# Patient Record
Sex: Male | Born: 2000
Health system: Southern US, Community
[De-identification: ages and names within clinical notes are randomized; demographics above are authoritative.]

## PROBLEM LIST (undated history)

## (undated) DIAGNOSIS — J45909 Unspecified asthma, uncomplicated: Secondary | ICD-10-CM

## (undated) DIAGNOSIS — T7840XA Allergy, unspecified, initial encounter: Secondary | ICD-10-CM

## (undated) HISTORY — DX: Unspecified asthma, uncomplicated: J45.909

## (undated) HISTORY — DX: Allergy, unspecified, initial encounter: T78.40XA

---
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
## 2018-03-23 DIAGNOSIS — L730 Acne keloid: Secondary | ICD-10-CM | POA: Diagnosis not present

## 2018-10-13 ENCOUNTER — Encounter: Payer: Self-pay | Admitting: Psychiatry

## 2018-10-13 ENCOUNTER — Ambulatory Visit: Payer: 59 | Admitting: Psychiatry

## 2018-10-13 ENCOUNTER — Other Ambulatory Visit: Payer: Self-pay

## 2018-10-13 VITALS — Ht 73.0 in | Wt >= 6400 oz

## 2018-10-13 DIAGNOSIS — F50819 Binge eating disorder, unspecified: Secondary | ICD-10-CM

## 2018-10-13 DIAGNOSIS — F5081 Binge eating disorder: Secondary | ICD-10-CM

## 2018-10-13 DIAGNOSIS — F900 Attention-deficit hyperactivity disorder, predominantly inattentive type: Secondary | ICD-10-CM | POA: Diagnosis not present

## 2018-10-13 DIAGNOSIS — F428 Other obsessive-compulsive disorder: Secondary | ICD-10-CM | POA: Diagnosis not present

## 2018-10-13 DIAGNOSIS — F429 Obsessive-compulsive disorder, unspecified: Secondary | ICD-10-CM | POA: Insufficient documentation

## 2018-10-13 DIAGNOSIS — F251 Schizoaffective disorder, depressive type: Secondary | ICD-10-CM | POA: Diagnosis not present

## 2018-10-13 MED ORDER — TOPIRAMATE 100 MG PO TABS: 100.0000 mg | ORAL_TABLET | Freq: Every day | ORAL | 1 refills | Status: AC

## 2018-10-13 MED ORDER — DULOXETINE HCL 60 MG PO CPEP: 60.0000 mg | ORAL_CAPSULE | Freq: Every day | ORAL | 1 refills | Status: AC

## 2018-10-13 NOTE — Progress Notes (Signed)
[facility]  Patient ID: Steven Mccall,  MRN: [id number]  PCP: Mirna MiresHill, Gerald, MD  Date of Evaluation: 10/13/2018 Time spent:20 minutes from 1000 to 1020  Chief Complaint:  Chief Complaint    Depression; Anxiety; Eating Disorder      HISTORY/CURRENT STATUS: Steven SchmidtKeenan is seen onsite in office face-to-face conjointly with mother with consent with epic collateral for psychiatric interview and exam in 6637-month evaluation and management of schizoaffective and generalized anxiety disorders.  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  He declined restarting Wellbutrin or Strattera at that time or considering Geodon, Navane, or Lamictal.  Mother had hoped his adult transition at that time would overcome poor focus, avoidance, and paranoia.  They return today mother uncertain about the status of employment or relationships.  Binge eating is continuing though as only 1 meal daily now over 400 pounds.  He considers his only pleasure to be eating.  Mother notes he did see PCP including for labs but they do not have results though mother will consider sending these from equivalent of MyChart.  Patient seeks help losing weight and also with mood and anxiety.  Their only conclusion is to seek medication again, though mother confronts, redirects, and supports the patient in doing so today when patient predominantly shuts down.  He has no active delusions or hallucinations evident today, though he has a history of such with current good prognostic factors except for his shutting down in inactivity while expanding in weight.   Individual Medical History/ Review of Systems: Changes? :Yes Mother suggests patient had an annual physical exam with lab results in the interim at PCP but these cannot be found through Care Everywhere  or Epic.  Mother may forward the results to us from their copy.  Allergies: Patient has no known allergies.  Current Medications:  Current Outpatient Medications:  .  DULoxetine (CYMBALTA) 60 MG capsule, Take 1 capsule (60 mg total) by mouth daily., Disp: 90 capsule, Rfl: 1 .  loratadine (CLARITIN) 10 MG tablet, Take 10 mg by mouth daily., Disp: , Rfl:  .  oxyCODONE (ROXICODONE) 5 MG immediate release tablet, Take 1 tablet (5 mg total) by mouth every 4 (four) hours as needed for severe pain., Disp: 3 tablet, Rfl: 0 .  topiramate (TOPAMAX) 100 MG tablet, Take 1 tablet (100 mg total) by mouth at bedtime., Disp: 90 tablet, Rfl: 1 .  triamcinolone (NASACORT) 55 MCG/ACT nasal inhaler, Place 2 sprays into the nose daily. 2 sprays each nostril daily., Disp: 1 Inhaler, Rfl: 1   Medication Side Effects: none  Family Medical/ Social History: Changes? Yes patient remains in passive covert competition with older brother and mother both of whom are employed while patient appears to likely have become unemployed biding his time to keep mother from finding out.  MENTAL HEALTH EXAM:  Height 6\' 1"  (1.854 m), weight (!) 410 lb (186 kg).Body mass index is 54.09 kg/m.  Others deferred as nonessential in coronavirus pandemic  General Appearance: Casual, Fairly Groomed and Obese  Eye Contact:  Minimal  Speech:  Blocked, Clear and Coherent and Slow  Volume:  Normal  Mood:  Anxious, Depressed, Dysphoric, Irritable and Worthless  Affect:  Constricted, Depressed, Inappropriate and Anxious  Thought Process:  Disorganized, Goal Directed, Irrelevant and Linear  Orientation:  Full (Time, Place, and Person)  Thought Content: Ilusions, Obsessions, Paranoid Ideation and Rumination   Suicidal Thoughts:  No  Homicidal Thoughts:  No  Memory:  Immediate;   Fair Remote;   Fair  Judgement:  Fair to poor  Insight:  Lacking  Psychomotor Activity:  Normal, Decreased and Mannerisms  Concentration:  Concentration: Fair and  Attention Span: Fair  Recall:  [employer] of Knowledge: Fair  Language: Fair  Assets:  Desire for Improvement Leisure Time Talents/Skills  ADL's:  Intact  Cognition: WNL  Prognosis:  Fair    DIAGNOSES:    ICD-10-CM   1. Schizoaffective disorder, depressive type with good prognostic features (HCC)  F25.1 DULoxetine (CYMBALTA) 60 MG capsule  2. Other obsessive-compulsive disorders  F42.8 DULoxetine (CYMBALTA) 60 MG capsule    topiramate (TOPAMAX) 100 MG tablet  3. Binge eating disorder  F50.81 DULoxetine (CYMBALTA) 60 MG capsule    topiramate (TOPAMAX) 100 MG tablet  4.     ADHD inattentive moderate                              F90.0  Receiving Psychotherapy: No    RECOMMENDATIONS: Mother appears to consider this a rare opportunity to clarify and mobilize or initiate change in the patient's current post high school fixations relative to shutting down work and education.  They know of no laboratory abnormalities that would definitely contraindicate medications, though at this time he appears best served by deferring antipsychotic treatment most recently Vraylar after Trilafon.  He is escribed Cymbalta 60 mg every bedtime as a 90-day supply and 1 refill sent to [facility] [address] for schizoaffective, OCD, binge eating and ADHD.  He is escribed Topamax 100 mg nightly #90 with 1 refill sent to [facility] for binge eating disorder.  They agree to follow-up in 3 months acknowledging education on prevention and monitoring, safety hygiene, and crisis plans if needed.   Delight Hoh, MD

## 2018-10-19 DIAGNOSIS — F900 Attention-deficit hyperactivity disorder, predominantly inattentive type: Secondary | ICD-10-CM | POA: Insufficient documentation

## 2019-01-13 ENCOUNTER — Encounter: Payer: Self-pay | Admitting: Psychiatry

## 2019-01-13 ENCOUNTER — Ambulatory Visit (INDEPENDENT_AMBULATORY_CARE_PROVIDER_SITE_OTHER): Payer: 59 | Admitting: Psychiatry

## 2019-01-13 ENCOUNTER — Other Ambulatory Visit: Payer: Self-pay

## 2019-01-13 DIAGNOSIS — F900 Attention-deficit hyperactivity disorder, predominantly inattentive type: Secondary | ICD-10-CM | POA: Diagnosis not present

## 2019-01-13 DIAGNOSIS — F5081 Binge eating disorder: Secondary | ICD-10-CM

## 2019-01-13 DIAGNOSIS — F422 Mixed obsessional thoughts and acts: Secondary | ICD-10-CM | POA: Diagnosis not present

## 2019-01-13 DIAGNOSIS — F251 Schizoaffective disorder, depressive type: Secondary | ICD-10-CM

## 2019-01-15 NOTE — Progress Notes (Signed)
[facility]  Patient ID: Steven Mccall,  MRN: [id number]  PCP: Steven Mires, MD  Date of Evaluation: 01/13/2019 Time spent:20 minutes from 1610 to 1630  Chief Complaint:  Chief Complaint    Paranoid; Depression; Anxiety; ADHD; Eating Disorder      HISTORY/CURRENT STATUS: Steven Mccall is seen onsite in office 20 minutes face-to-face conjointly with mother who however seeks to give him dividual opportunity for the first half of session with consent with epic collateral for psychiatric interview and exam in evaluation and management of schizoaffective disorder depressive comorbid with social anxiety, obsessive compulsive, and binge earting symptoms seeming independently primary over time.  Since initial care here by Dr. Tomasa Rand in 2015, patient has received Abilify, Latuda, Seroquel, Trilafon, and Vraylar for schizoaffective as well as Wellbutrin, Adderall, Strattera, Topamax, and Cymbalta for comorbid symptoms without sustained success patient concluding he is no better on medication while having modest side effects.  Most recently he complained of nausea and diminished energy when on Cymbalta and sleeplessness overthinking messing up his mind apparently from Topamax taking each only a week.  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  Mother notes he paces at home when he is thinking and is often late with low self-esteem.  He has no interest in medication having few complaints today just disengaging concluding with thorough discussion by mother and I that he would rather just have a therapist.  He is not homicidal, suicidal, floridly hallucinating, manic, or delirious.  Depression      The patient presents with depression.  This is a recurrent problem.  The current episode started more than 1 year ago.   The onset  quality is sudden.   The problem occurs intermittently.  The problem has been waxing and waning since onset.  Associated symptoms include decreased concentration, hopelessness, insomnia, irritable, decreased interest and sad.     The symptoms are aggravated by family issues, work stress and social issues.  Past treatments include psychotherapy, other medications and SNRIs - Serotonin and norepinephrine reuptake inhibitors.  Past compliance problems include medication issues and difficulty with treatment plan.  Risk factors include the patient not taking medications correctly, stress, major life event, family history, family history of mental illness, history of mental illness and a change in medication usage/dosage.   Past medical history includes anxiety, eating disorder, depression, mental health disorder, obsessive-compulsive disorder and schizophrenia.     Pertinent negatives include no life-threatening condition, no physical disability, no recent psychiatric admission, no bipolar disorder, no suicide attempts and no head trauma.   Individual Medical History/ Review of Systems: Changes? :No   Allergies: Patient has no known allergies.  Current Medications:  Current Outpatient Medications:  .  DULoxetine (CYMBALTA) 60 MG capsule, Take 1 capsule (60 mg total) by mouth daily., Disp: 90 capsule, Rfl: 1 .  loratadine (CLARITIN) 10 MG tablet, Take 10 mg by mouth daily., Disp: , Rfl:  .  oxyCODONE (ROXICODONE) 5 MG immediate release tablet, Take 1 tablet (5 mg total) by mouth every 4 (four) hours as needed for severe pain., Disp: 3 tablet, Rfl: 0 .  topiramate (TOPAMAX) 100 MG tablet, Take 1 tablet (100 mg total) by mouth at bedtime., Disp: 90 tablet, Rfl: 1 .  triamcinolone (NASACORT) 55 MCG/ACT nasal inhaler, Place 2 sprays into the nose daily.  2 sprays each nostril daily., Disp: 1 Inhaler, Rfl: 1 Medication Side Effects: confusion, fatigue/weakness, nausea and weight gain  Family Medical/ Social  History: Changes? No  MENTAL HEALTH EXAM:  There were no vitals taken for this visit.There is no height or weight on file to calculate BMI.  Weight beyond the limits of office scale with height 73 inches, Muscle strengths and tone 5/5, postural reflexes and gait 0/0, and AIMS = 0, others deferred for coronavirus shutdown  General Appearance: Bizarre, Disheveled, Guarded, Meticulous and Obese  Eye Contact:  Minimal to fair  Speech:  Blocked, Clear and Coherent and Slow  Volume:  Normal  Mood:  Anxious, Dysphoric, Worthless and apathetic with negative negative inertia and interest  Affect:  Non-Congruent, Flat, Inappropriate and Anxious  Thought Process:  Disorganized, Irrelevant and Descriptions of Associations: Loose  Orientation:  Full (Time, Place, and Person)  Thought Content: Delusions, Ideas of Reference:   Paranoia, Ilusions, Obsessions and Paranoid Ideation   Suicidal Thoughts:  No  Homicidal Thoughts:  No  Memory:  Immediate;   Fair Remote;   Fair  Judgement:  Impaired  Insight:  Lacking  Psychomotor Activity:  Normal, Decreased, Mannerisms and Psychomotor Retardation  Concentration:  Concentration: Poor and Attention Span: Poor  Recall:  Poor  Fund of Knowledge: Fair  Language: Poor  Assets:  Leisure Time Resilience Talents/Skills  ADL's:  Impaired  Cognition: WNL as to intellectual capability but however issues executive and social function  Prognosis:  Fair to poor    DIAGNOSES:    ICD-10-CM   1. Schizoaffective disorder, depressive type with good prognostic features ([place])  F25.1   2. Mixed obsessional thoughts and acts  F42.2   3. Attention deficit hyperactivity disorder (ADHD), inattentive type, moderate  F90.0   4. Binge eating disorder  F50.81     Receiving Psychotherapy: No Willing to consider today seeing Lanetta Inch, [facility] for supportive psychotherapy and possibly getting with the [facility] adult adaptation to chronic mental  illness.   RECOMMENDATIONS: Cymbalta and Topamax are discontinued reportedly for side effects though patient has not given them a fair opportunity to be effective.  Mother was encouraged at last appointment that the patient seemed to want help and to be more open to treatment than any time in the last couple of years.  However patient has again shut down and sealed over that availability to medication management which has been extended over total of 5 years.  He has changed jobs in the interim and seems likely to terminate his current job in the near future.  Psychosupportive psychoeducation is provided for interactive cognitive behavioral sleep hygiene, behavioral nutrition, social skills, and anger management Geodon may be the next best choice if he will accept any though he declines at this time.  Over 50% of the 20-minute session face-to-face is spent for a total of 10 minutes on counseling and coordination of care mobilizing from patient before parturition to make an appointment on the way out with Lanetta Inch, [facility] and to be seen at the [facility].  He defers follow-up appointment here until he is ready for medication possibly Geodon or Clozaril.  They accept warnings and risk for prevention and monitoring and safety hygiene relative to diagnoses even though refusing medication.  He will hopefully return in 3 to 12 months after a trial of therapy and programming at [facility] but either he or mother may phone have [facility] started in the interim if willing.   Delight Hoh,  MD

## 2020-01-25 ENCOUNTER — Encounter: Payer: Self-pay | Admitting: Psychiatry
